# Patient Record
Sex: Female | Born: 2000 | Hispanic: No | Marital: Single
Health system: Southern US, Community
[De-identification: ages and names within clinical notes are randomized; demographics above are authoritative.]

---
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
## 2023-02-16 ENCOUNTER — Other Ambulatory Visit: Payer: Self-pay

## 2023-02-16 ENCOUNTER — Emergency Department (HOSPITAL_COMMUNITY)
Admission: EM | Admit: 2023-02-16 | Discharge: 2023-02-16 | Disposition: A | Discharge status: Home / Self Care | Payer: Medicaid Other | Attending: Emergency Medicine | Admitting: Emergency Medicine

## 2023-02-16 ENCOUNTER — Encounter (HOSPITAL_COMMUNITY): Payer: Self-pay

## 2023-02-16 DIAGNOSIS — R300 Dysuria: Secondary | ICD-10-CM | POA: Diagnosis present

## 2023-02-16 DIAGNOSIS — N3001 Acute cystitis with hematuria: Secondary | ICD-10-CM | POA: Diagnosis not present

## 2023-02-16 LAB — URINALYSIS, ROUTINE W REFLEX MICROSCOPIC
Bilirubin Urine: NEGATIVE
Glucose, UA: NEGATIVE mg/dL
Hgb urine dipstick: NEGATIVE
Ketones, ur: NEGATIVE mg/dL
Nitrite: NEGATIVE
Protein, ur: NEGATIVE mg/dL
Specific Gravity, Urine: 1.002 — ABNORMAL LOW (ref 1.005–1.030)
pH: 7 (ref 5.0–8.0)

## 2023-02-16 LAB — PREGNANCY, URINE: Preg Test, Ur: NEGATIVE

## 2023-02-16 MED ORDER — CEPHALEXIN 500 MG PO CAPS
500.0000 mg | ORAL_CAPSULE | Freq: Two times a day (BID) | ORAL | 0 refills | Status: AC
Start: 2023-02-16 — End: 2023-02-23

## 2023-02-16 NOTE — Discharge Instructions (Addendum)
I have prescribed antibiotics in order to help treat your urinary tract infection, please take 1 tablet twice a day for the next 7 days.  If you experience any fever, back pain, vomiting you will need to return to the emergency department.

## 2023-02-16 NOTE — ED Provider Notes (Signed)
[place] EMERGENCY DEPARTMENT AT [facility] Provider Note   CSN: [id number] Arrival date & time: 02/16/23  1611     History  Chief Complaint  Patient presents with   Dysuria    Carla Bates is a 22 y.o. female.  22 y.o female with a past medical history of recurrent UTIs presents to the ED with dysuria, frequency, feels like she is not fully emptying her bladder.  Patient has a prior history of recurrent UTIs, was on maintenance therapy by her urologist but stopped taking this medication about a month ago as she reports she has not been able to follow-up with him.  She also endorses some foul odor to her urine.  She is having some lower abdominal pressure every time she voids.  She reports the symptoms feel similar to her prior UTIs, however they have not been that bad.  She has not tried any over-the-counter medication.  She is not having any fever, no back pain, no nausea or vomiting.  Denies any vaginal discharge or vaginal bleeding. LMP unknown.   The history is provided by the patient and medical records.  Dysuria Associated symptoms: no abdominal pain, no fever and no flank pain        Home Medications Prior to Admission medications   Medication Sig Start Date End Date Taking? Authorizing Provider  cephALEXin (KEFLEX) 500 MG capsule Take 1 capsule (500 mg total) by mouth 2 (two) times daily for 7 days. 02/16/23 02/23/23 Yes Charlet Harr, Leonie Douglas, PA-C      Allergies    Shellfish allergy    Review of Systems   Review of Systems  Constitutional:  Negative for fever.  Respiratory:  Negative for shortness of breath.   Cardiovascular:  Negative for chest pain.  Gastrointestinal:  Negative for abdominal pain.  Genitourinary:  Positive for dysuria and urgency. Negative for flank pain and hematuria.    Physical Exam Updated Vital Signs BP 111/61 (BP Location: Right Arm)   Pulse 91   Temp 98.9 F (37.2 C) (Oral)   Resp 15   Wt 91.6 kg   SpO2 100%  Physical  Exam Vitals and nursing note reviewed.  Constitutional:      Appearance: Normal appearance.  HENT:     Head: Normocephalic and atraumatic.     Mouth/Throat:     Mouth: Mucous membranes are moist.  Cardiovascular:     Rate and Rhythm: Normal rate.  Pulmonary:     Effort: Pulmonary effort is normal.  Abdominal:     General: Abdomen is flat.     Tenderness: There is abdominal tenderness in the suprapubic area. There is no right CVA tenderness or left CVA tenderness.     Comments: No CVA tenderness, suprapubic tenderness.   Musculoskeletal:     Cervical back: Normal range of motion and neck supple.  Skin:    General: Skin is warm and dry.  Neurological:     Mental Status: She is alert and oriented to person, place, and time.     ED Results / Procedures / Treatments   Labs (all labs ordered are listed, but only abnormal results are displayed) Labs Reviewed  URINALYSIS, ROUTINE W REFLEX MICROSCOPIC - Abnormal; Notable for the following components:      Result Value   Color, Urine STRAW (*)    Specific Gravity, Urine 1.002 (*)    Leukocytes,Ua SMALL (*)    Bacteria, UA FEW (*)    All other components within normal limits  URINE  CULTURE  PREGNANCY, URINE    EKG None  Radiology No results found.  Procedures Procedures    Medications Ordered in ED Medications - No data to display  ED Course/ Medical Decision Making/ A&P Clinical Course as of 02/16/23 1816  Sat Feb 16, 2023  1748 Glori Luis): SMALL [JS]  1748 Bacteria, UA(!): FEW [JS]  1748 WBC, UA: 11-20 [JS]    Clinical Course User Index [JS] Claude Manges, PA-C                                 Medical Decision Making Amount and/or Complexity of Data Reviewed Labs: ordered. Decision-making details documented in ED Course.  Risk Prescription drug management.    Patient presents to the ED with a chief complaint of dysuria, hematuria, urgency which has been ongoing for the past 3 days, no treatment at  home.  Prior history of recurrent UTIs, previously followed by urology with a Cytol Skippy and placed on maintenance medication to prevent further urinary tract infections, however has not seen her urologist in several months.  Feels that the symptoms are similar to her prior, however they are not as bad per patient.  On evaluation there is no suprapubic tenderness.  No CVA tenderness, she is afebrile, no nausea or vomiting to suspect pyelonephritis at this time.  UA does show 11-20 white blood cell count, few bacteria, small leukocytes, this was sent out for culture at this time.  Prior review of urine cultures does report sensitivity to Keflex, will place her on a Keflex course of 7 days twice daily.  She is agreeable to plan and treatment, return precautions discussed at length.  Patient is hemodynamically stable for discharge.   Portions of this note were generated with Scientist, clinical (histocompatibility and immunogenetics). Dictation errors may occur despite best attempts at proofreading.   Final Clinical Impression(s) / ED Diagnoses Final diagnoses:  Acute cystitis with hematuria    Rx / DC Orders ED Discharge Orders          Ordered    cephALEXin (KEFLEX) 500 MG capsule  2 times daily        02/16/23 1806              Claude Manges, PA-C 02/16/23 1817    Gwyneth Sprout, MD 02/17/23 1504

## 2023-02-16 NOTE — ED Triage Notes (Signed)
Pt arrives with c/o dysuria that started a few days ago. Pt endorses pelvic discomfort, urinary frequency and urgency,and urine has a odor. Pt denies fevers.

## 2023-02-18 LAB — URINE CULTURE: Culture: >=100000 — AB

## 2023-02-19 ENCOUNTER — Telehealth (HOSPITAL_BASED_OUTPATIENT_CLINIC_OR_DEPARTMENT_OTHER): Payer: Self-pay | Admitting: *Deleted

## 2023-02-19 NOTE — Telephone Encounter (Signed)
Post ED Visit - Positive Culture Follow-up  Culture report reviewed by antimicrobial stewardship pharmacist: [facility] []  Enzo Bi, Pharm.D. [x]  Celedonio Miyamoto, Pharm.D., BCPS AQ-ID []  Garvin Fila, Pharm.D., BCPS []  Georgina Pillion, [address].D., BCPS []  [place], [address].D., BCPS, AAHIVP []  Estella Husk, Pharm.D., BCPS, AAHIVP []  Lysle Pearl, PharmD, BCPS []  Phillips Climes, PharmD, BCPS []  Agapito Games, PharmD, BCPS []  Verlan Friends, PharmD []  Mervyn Gay, PharmD, BCPS []  Vinnie Level, PharmD  [facility] []  Len Childs, PharmD []  Greer Pickerel, PharmD []  Adalberto Cole, PharmD []  Perlie Gold, Rph []  Lonell Face) Jean Rosenthal, PharmD []  Earl Many, PharmD []  Junita Push, PharmD []  Dorna Leitz, PharmD []  Terrilee Files, PharmD []  Lynann Beaver, PharmD []  Keturah Barre, PharmD []  Loralee Pacas, PharmD []  Bernadene Person, PharmD   Positive urine culture Treated with cephalexin, organism sensitive to the same and no further patient follow-up is required at this time.  Nena Polio Garner Nash 02/19/2023, 9:59 AM

## 2023-04-13 ENCOUNTER — Emergency Department (HOSPITAL_COMMUNITY)
Admission: EM | Admit: 2023-04-13 | Discharge: 2023-04-13 | Disposition: A | Discharge status: Home / Self Care | Payer: Medicaid Other | Attending: Emergency Medicine | Admitting: Emergency Medicine

## 2023-04-13 ENCOUNTER — Encounter (HOSPITAL_COMMUNITY): Payer: Self-pay

## 2023-04-13 ENCOUNTER — Other Ambulatory Visit: Payer: Self-pay

## 2023-04-13 DIAGNOSIS — N760 Acute vaginitis: Secondary | ICD-10-CM

## 2023-04-13 DIAGNOSIS — R300 Dysuria: Secondary | ICD-10-CM | POA: Diagnosis present

## 2023-04-13 DIAGNOSIS — B3731 Acute candidiasis of vulva and vagina: Secondary | ICD-10-CM | POA: Diagnosis not present

## 2023-04-13 LAB — URINALYSIS, ROUTINE W REFLEX MICROSCOPIC
Bacteria, UA: NONE SEEN
Bilirubin Urine: NEGATIVE
Glucose, UA: NEGATIVE mg/dL
Hgb urine dipstick: NEGATIVE
Ketones, ur: NEGATIVE mg/dL
Nitrite: NEGATIVE
Protein, ur: NEGATIVE mg/dL
Specific Gravity, Urine: 1.002 — ABNORMAL LOW (ref 1.005–1.030)
pH: 6 (ref 5.0–8.0)

## 2023-04-13 LAB — WET PREP, GENITAL
Sperm: NONE SEEN
Trich, Wet Prep: NONE SEEN
WBC, Wet Prep HPF POC: >=10 — AB (ref <10)

## 2023-04-13 LAB — PREGNANCY, URINE: Preg Test, Ur: NEGATIVE

## 2023-04-13 MED ORDER — FLUCONAZOLE 150 MG PO TABS
150.0000 mg | ORAL_TABLET | Freq: Once | ORAL | Status: AC
  Administered 2023-04-13: 150 mg via ORAL
  Filled 2023-04-13: qty 1

## 2023-04-13 MED ORDER — METRONIDAZOLE 500 MG PO TABS: 500.0000 mg | ORAL_TABLET | Freq: Two times a day (BID) | ORAL | 0 refills | Status: AC

## 2023-04-13 MED ORDER — METRONIDAZOLE 500 MG PO TABS
500.0000 mg | ORAL_TABLET | Freq: Once | ORAL | Status: AC
  Administered 2023-04-13: 500 mg via ORAL
  Filled 2023-04-13: qty 1

## 2023-04-13 NOTE — ED Provider Notes (Signed)
[place] [facility] DEPARTMENT AT [facility] Provider Note   CSN: [id number] Arrival date & time: 04/13/23  1645     History  Chief Complaint  Patient presents with   UTI    Carla Bates is a 22 y.o. female past medical history significant for frequent urinary tract infections who presents today for dysuria.  Patient states she began experiencing dysuria this morning and also has pelvic pain.  She denies hematuria, flank pain, fevers, new sexual partners, potential STI exposure.  She is to be followed by urology but has not been seen since June 2023.  She expresses wanting to reestablish care with urology.  She was seen 03/15/2019 for for acute cystitis for which she completed the antibiotics and had symptom relief.  HPI     Home Medications Prior to Admission medications   Medication Sig Start Date End Date Taking? Authorizing Provider  metroNIDAZOLE (FLAGYL) 500 MG tablet Take 1 tablet (500 mg total) by mouth 2 (two) times daily. 04/13/23  Yes Dolphus Jenny, PA-C      Allergies    Shellfish allergy    Review of Systems   Review of Systems  Constitutional:  Negative for chills and fever.  Gastrointestinal:  Positive for vomiting. Negative for diarrhea and nausea.  Genitourinary:  Positive for dysuria, frequency and pelvic pain. Negative for hematuria.  Neurological:  Negative for weakness.    Physical Exam Updated Vital Signs BP 114/66 (BP Location: Right Arm)   Pulse 85   Temp 98 F (36.7 C)   Resp 18   Ht 5\' 3"  (1.6 m)   Wt 90.7 kg   SpO2 97%   BMI 35.43 kg/m  Physical Exam Vitals and nursing note reviewed.  Constitutional:      General: She is not in acute distress.    Appearance: She is well-developed.  HENT:     Head: Normocephalic and atraumatic.  Eyes:     Conjunctiva/sclera: Conjunctivae normal.     Pupils: Pupils are equal, round, and reactive to light.  Cardiovascular:     Rate and Rhythm: Normal rate and regular rhythm.     Heart  sounds: No murmur heard. Pulmonary:     Effort: Pulmonary effort is normal. No respiratory distress.     Breath sounds: Normal breath sounds.  Abdominal:     Palpations: Abdomen is soft.     Tenderness: There is abdominal tenderness in the suprapubic area.  Musculoskeletal:        General: No swelling.     Cervical back: Neck supple.     Right lower leg: No edema.     Left lower leg: No edema.  Skin:    General: Skin is warm and dry.  Neurological:     General: No focal deficit present.     Mental Status: She is alert.  Psychiatric:        Mood and Affect: Mood normal.     ED Results / Procedures / Treatments   Labs (all labs ordered are listed, but only abnormal results are displayed) Labs Reviewed  WET PREP, GENITAL - Abnormal; Notable for the following components:      Result Value   Yeast Wet Prep HPF POC PRESENT (*)    Clue Cells Wet Prep HPF POC PRESENT (*)    WBC, Wet Prep HPF POC >=10 (*)    All other components within normal limits  URINALYSIS, ROUTINE W REFLEX MICROSCOPIC - Abnormal; Notable for the following components:  Color, Urine STRAW (*)    Specific Gravity, Urine 1.002 (*)    Leukocytes,Ua SMALL (*)    All other components within normal limits  URINE CULTURE  PREGNANCY, URINE    EKG None  Radiology No results found.  Procedures Procedures    Medications Ordered in ED Medications  metroNIDAZOLE (FLAGYL) tablet 500 mg (has no administration in time range)  fluconazole (DIFLUCAN) tablet 150 mg (has no administration in time range)    ED Course/ Medical Decision Making/ A&P                                 Medical Decision Making Amount and/or Complexity of Data Reviewed Labs: ordered.  This patient presents to the ED with chief complaint(s) of dysuria with pertinent past medical history of frequent UTIs which further complicates the presenting complaint. The complaint involves an extensive differential diagnosis and also carries with it  a high risk of complications and morbidity.    The differential diagnosis includes UTI, yeast infection, BV  Additional history obtained: Records reviewed urology office visit notes  ED Course and Reassessment: Discussed with patient treatment for BV and yeast infection.  Explained to patient risks with frequent antibiotic use and not treating UTI when she was positive for BV and yeast. Urine sent for culture given history of frequent UTIs  Independent labs interpretation:  The following labs were independently interpreted:  UA: Negative for bacteria Wet prep: Yeast and clue cells present  Consultation: - Consulted or discussed management/test interpretation w/ external professional: None  Consideration for admission or further workup: Has a history of frequent UTIs, used is to be followed by urology and would like to reestablish care.         Final Clinical Impression(s) / ED Diagnoses Final diagnoses:  Vulvovaginal candidiasis  BV (bacterial vaginosis)    Rx / DC Orders ED Discharge Orders          Ordered    metroNIDAZOLE (FLAGYL) 500 MG tablet  2 times daily        04/13/23 2333              Dolphus Jenny, PA-C 04/13/23 2336    Palumbo, April, MD 04/14/23 2130

## 2023-04-13 NOTE — ED Notes (Addendum)
PT ambulate to bathroom and provided Urine sample for lab.

## 2023-04-13 NOTE — ED Triage Notes (Signed)
Pt came in via POV d/t burning when she urinates since this morning, denies recent fevers, blood in her urine or flank pain. Does have lower abd pain. A/Ox4, rates her pain 3/10 while in triage.

## 2023-04-13 NOTE — Discharge Instructions (Addendum)
Today you were treated for BV and a yeast infection.  Your urine was sent off for culture and you will get a call in approximately 2 days if treatment is needed.  Thank you for letting us treat you today. After reviewing your labs and imaging, I feel you are safe to go home. Please follow up with your PCP in the next several days and provide them with your records from this visit. Return to the Emergency Room if pain becomes severe or symptoms worsen.

## 2023-04-16 LAB — URINE CULTURE: Culture: >=100000 — AB

## 2023-04-17 ENCOUNTER — Telehealth (HOSPITAL_BASED_OUTPATIENT_CLINIC_OR_DEPARTMENT_OTHER): Payer: Self-pay

## 2023-04-17 NOTE — Telephone Encounter (Signed)
Post ED Visit - Positive Culture Follow-up  Culture report reviewed by antimicrobial stewardship pharmacist: [facility] [x]  Theotis Burrow, [place].D. []  Celedonio Miyamoto, Pharm.D., BCPS AQ-ID []  Garvin Fila, Pharm.D., BCPS []  Georgina Pillion, [address].D., BCPS []  [place], [address].D., BCPS, AAHIVP []  Estella Husk, Pharm.D., BCPS, AAHIVP []  Lysle Pearl, PharmD, BCPS []  Phillips Climes, PharmD, BCPS []  Agapito Games, PharmD, BCPS []  Verlan Friends, PharmD []  Mervyn Gay, PharmD, BCPS []  Vinnie Level, PharmD  [facility] []  Len Childs, PharmD []  Greer Pickerel, PharmD []  Adalberto Cole, PharmD []  Perlie Gold, Rph []  Lonell Face) Jean Rosenthal, PharmD []  Earl Many, PharmD []  Junita Push, PharmD []  Dorna Leitz, PharmD []  Terrilee Files, PharmD []  Lynann Beaver, PharmD []  Keturah Barre, PharmD []  Loralee Pacas, PharmD []  Bernadene Person, PharmD   Positive urine culture Treated with Metronidazole, organism sensitive to the same and no further patient follow-up is required at this time.  Sandria Senter 04/17/2023, 9:51 AM

## 2023-04-17 NOTE — Progress Notes (Addendum)
ED Antimicrobial Stewardship Positive Culture Follow Up   [name] is an 22 y.o. female who presented to [facility] on 04/13/2023 with a chief complaint of UTI  Chief Complaint  Patient presents with   UTI    Recent Results (from the past 720 hour(s))  Wet prep, genital     Status: Abnormal   Collection Time: 04/13/23 10:56 PM   Specimen: Vaginal  Result Value Ref Range Status   Yeast Wet Prep HPF POC PRESENT (A) NONE SEEN Final   Trich, Wet Prep NONE SEEN NONE SEEN Final   Clue Cells Wet Prep HPF POC PRESENT (A) NONE SEEN Final    Comment: Swab received with less than 0.5 mL of saline, saline added to specimen, interpret results with caution.   WBC, Wet Prep HPF POC >=10 (A) <10 Final   Sperm NONE SEEN  Final    Comment: Performed at [facility], [address], [place], [place] [address]  Urine Culture     Status: Abnormal   Collection Time: 04/13/23 11:48 PM   Specimen: Urine, Clean Catch  Result Value Ref Range Status   Specimen Description URINE, CLEAN CATCH  Final   Special Requests   Final    NONE Performed at [facility], [address], [place], [place] [address]    Culture >=100,000 COLONIES/mL ESCHERICHIA COLI (A)  Final   Report Status 04/16/2023 FINAL  Final   Organism ID, Bacteria ESCHERICHIA COLI (A)  Final      Susceptibility   Escherichia coli - MIC*    AMPICILLIN 4 SENSITIVE Sensitive     CEFAZOLIN <=4 SENSITIVE Sensitive     CEFEPIME <=0.12 SENSITIVE Sensitive     CEFTRIAXONE <=0.25 SENSITIVE Sensitive     CIPROFLOXACIN <=0.25 SENSITIVE Sensitive     GENTAMICIN <=1 SENSITIVE Sensitive     IMIPENEM <=0.25 SENSITIVE Sensitive     NITROFURANTOIN <=16 SENSITIVE Sensitive     TRIMETH/SULFA <=20 SENSITIVE Sensitive     AMPICILLIN/SULBACTAM <=2 SENSITIVE Sensitive     PIP/TAZO <=4 SENSITIVE Sensitive     * >=100,000 COLONIES/mL ESCHERICHIA COLI    22 yo female patient who came in for burning during urination and lower abdominal pain. Pt  had a recent UTI treated with Keflex. Wet prep was done that showed clue cells and yeast. UA negative for white blood cells and nitrites with only trace leukocytes. Symptoms were attributed to bacterial vaginosis and vaginal candidiasis and not UTI. Patient was treated with one dose of fluconazole in the ED and sent home with metronidazole for bacterial vaginosis.   New antibiotic prescription: None needed  ED Provider: Virgina Norfolk, DO    Griffin Dakin 04/17/2023, 8:45 AM Clinical Pharmacist Monday - Friday phone -  [phone] Saturday - Sunday phone - [phone]

## 2024-04-06 ENCOUNTER — Encounter (HOSPITAL_COMMUNITY): Payer: Self-pay | Admitting: Student in an Organized Health Care Education/Training Program

## 2024-04-14 ENCOUNTER — Ambulatory Visit (INDEPENDENT_AMBULATORY_CARE_PROVIDER_SITE_OTHER)

## 2024-04-14 DIAGNOSIS — F411 Generalized anxiety disorder: Secondary | ICD-10-CM

## 2024-04-14 DIAGNOSIS — F331 Major depressive disorder, recurrent, moderate: Secondary | ICD-10-CM | POA: Diagnosis not present

## 2024-04-14 NOTE — Progress Notes (Signed)
 Comprehensive Clinical Assessment (CCA) Note  04/14/2024 Carla Bates [id number]  Chief Complaint:  Chief Complaint  Patient presents with   Anxiety    Depression    Visit Diagnosis: Major Depressive Disorder, recurrent, Moderate, Generalized Anxiety Disorder   CCA Screening, Triage and Referral (STR)  Patient Reported Information How did you hear about us ? Family/Friend  Referral name: sister  Referral phone number: No data recorded  Whom do you see for routine medical problems? Primary Care  Practice/Facility Name: [facility]  Practice/Facility Phone Number: No data recorded Name of Contact: No data recorded Contact Number: No data recorded Contact Fax Number: No data recorded Prescriber Name: No data recorded Prescriber Address (if known): No data recorded  What Is the Reason for Your Visit/Call Today? depression, anxiety  How Long Has This Been Causing You Problems? 1-6 months  What Do You Feel Would Help You the Most Today? Treatment for Depression or other mood problem   Have You Recently Been in Any Inpatient Treatment (Hospital/Detox/Crisis [facility]/28-Day Program)? No  Name/Location of Program/Hospital:No data recorded How Long Were You There? No data recorded When Were You Discharged? No data recorded  Have You Ever Received Services From [facility] Before? Yes  Who Do You See at [facility]? family MD   Have You Recently Had Any Thoughts About Hurting Yourself? No  Are You Planning to Commit Suicide/Harm Yourself At This time? No   Have you Recently Had Thoughts About Hurting Someone Carla Bates? No  Explanation: No data recorded  Have You Used Any Alcohol or Drugs in the Past 24 Hours? No  How Long Ago Did You Use Drugs or Alcohol? No data recorded What Did You Use and How Much? No data recorded  Do You Currently Have a Therapist/Psychiatrist? No  Name of Therapist/Psychiatrist: No data recorded  Have You Been Recently Discharged From Any  Office Practice or Programs? No data recorded Explanation of Discharge From Practice/Program: No data recorded    CCA Screening Triage Referral Assessment Type of Contact: Face-to-Face  Is this Initial or Reassessment? No data recorded Date Telepsych consult ordered in [facility]:  No data recorded Time Telepsych consult ordered in [facility]:  No data recorded  Patient Reported Information Reviewed? No data recorded Patient Left Without Being Seen? No data recorded Reason for Not Completing Assessment: No data recorded  Collateral Involvement: Mother is accompanying Carla Bates   Does Patient Have a Automotive engineer Guardian? No data recorded Name and Contact of Legal Guardian: No data recorded If Minor and Not Living with Parent(s), Who has Custody? No data recorded Is CPS involved or ever been involved? Never  Is APS involved or ever been involved? Never   Patient Determined To Be At Risk for Harm To Self or Others Based on Review of Patient Reported Information or Presenting Complaint? No  Method: No Plan  Availability of Means: No data recorded Intent: Vague intent or NA  Notification Required: No need or identified person  Additional Information for Danger to Others Potential: No data recorded Additional Comments for Danger to Others Potential: No data recorded Are There Guns or Other Weapons in Your Home? No  Types of Guns/Weapons: No data recorded Are These Weapons Safely Secured?                            No data recorded Who Could Verify You Are Able To Have These Secured: No data recorded Do You Have any Outstanding Charges,  Pending Court Dates, Parole/Probation? No data recorded Contacted To Inform of Risk of Harm To Self or Others: No data recorded  Location of Assessment: [facility]   Does Patient Present under Involuntary Commitment? No  IVC Papers Initial File Date: No data recorded  [place] of Residence: [place]   Patient Currently Receiving  the Following Services: Not Receiving Services   Determination of Need: Routine (7 days)   Options For Referral: Outpatient Therapy; Medication Management     CCA Biopsychosocial Intake/Chief Complaint:  Carla Bates presents to the walk in clinic today. She is accompanied by her mother.  She says she has an appointment with Dr. Cecily on Thrusday, though this therapist does not see it in Epic.  Carla Bates scores a 13 on both her PHQ-9 and GAD-7.   She says her anxiety onset about 2 months ago as the stress of school is triggering her anxiety. She reports her depression is no worse than it was in 2020. Her mother reports that as a later teenager she noticed Carla Bates had difficulties with adjust ments in life and that is when she noticed her daughter was not as happy as she used to be.  Mother says her sister is seen at this clinic but she did not realized until then that Carla Bates had issues. Mother reports Carla Bates received [facility] at [facility] when she was a teenager.  Family History of Mental Health and Substance Use: Carla Bates Sister is seen here at [facility] Carla Bates and her mother report no know history of family susbstance use. Carla Bates is in her 4th year of undergraduate school and is studying nursing.   Current Symptoms/Problems: PHQ-9 is 9.  GAD-7 is 13.   Patient Reported Schizophrenia/Schizoaffective Diagnosis in Past: No   Strengths: good Consulting civil engineer, Loves to learn  Preferences: out pt services  Abilities: communications  Mother says she draws and paints before she became depressed   Type of Services Patient Feels are Needed: medication and therapy   Initial Clinical Notes/Concerns: No data recorded  Mental Health Symptoms Depression:  -- (PHQ-9 is 9)   Duration of Depressive symptoms: No data recorded  Mania:  None   Anxiety:   -- (GAD-7 is 13)   Psychosis:  None   Duration of Psychotic symptoms: No data recorded  Trauma:  None   Obsessions:  None    Compulsions:  None   Inattention:  None   Hyperactivity/Impulsivity:  No data recorded  Oppositional/Defiant Behaviors:  None   Emotional Irregularity:  None   Other Mood/Personality Symptoms:  No data recorded   Mental Status Exam Appearance and self-care  Stature:  No data recorded  Weight:  Average weight   Clothing:  Casual   Grooming:  Normal   Cosmetic use:  None   Posture/gait:  Normal   Motor activity:  Not Remarkable   Sensorium  Attention:  Normal   Concentration:  Normal   Orientation:  X5   Recall/memory:  Normal   Affect and Mood  Affect:  Anxious   Mood:  Other (Comment) (unsure)   Relating  Eye contact:  Normal   Facial expression:  Responsive   Attitude toward examiner:  Cooperative   Thought and Language  Speech flow: Clear and Coherent   Thought content:  Appropriate to Mood and Circumstances   Preoccupation:  Other (Comment)   Hallucinations:  Other (Comment)   Organization:  No data recorded  [employer] of Knowledge:  No data recorded  Intelligence:  No  data recorded  Abstraction:  Functional   Judgement:  Fair   Reality Testing:  Adequate   Insight:  Fair   Decision Making:  No data recorded  Social Functioning  Social Maturity:  Responsible   Social Judgement:  Normal   Stress  Stressors:  School; Transitions   Coping Ability:  Human resources officer Deficits:  None   Supports:  Family; Friends/Service system     Religion: Religion/Spirituality Are You A Religious Person?: Yes What is Your Religious Affiliation?: Catholic  Leisure/Recreation: Leisure / Recreation Do You Have Hobbies?: Yes Leisure and Hobbies: painting and drawing  Exercise/Diet: Exercise/Diet Do You Exercise?: No Have You Gained or Lost A Significant Amount of Weight in the Past Six Months?: No Do You Follow a Special Diet?: No Do You Have Any Trouble Sleeping?: Yes Explanation of Sleeping Difficulties: difficulty  sustaining food   CCA Employment/Education Employment/Work Situation: Employment / Work Situation Employment Situation: Surveyor, minerals Job has Been Impacted by Current Illness: Yes Describe how Patient's Job has Been Impacted: feels like she does not fit in on campus.  Can studying What is the Longest Time Patient has Held a Job?: student Has Patient ever Been in the [employer]?: No  Education: Education Is Patient Currently Attending School?: Yes School Currently Attending: [employer] Last Grade Completed: 13 Did You Graduate From [employer]?: Yes Did You Attend College?: Yes What Type of College Degree Do you Have?: in 4th year for BS in Biology . wants to be a nurse Did You Attend Graduate School?: No Did You Have Any Difficulty At School?: No Patient's Education Has Been Impacted by Current Illness: Yes How Does Current Illness Impact Education?: feels left out and people do not like her   CCA Family/Childhood History Family and Relationship History: Family history Marital status: Single Does patient have children?: No  Childhood History:  Childhood History By whom was/is the patient raised?: Mother Description of patient's relationship with caregiver when they were a child: Pt says her father did not participate in raising her and that her mother raised her. Patient's description of current relationship with people who raised him/her: no contact with father.  He lives in [place].  Close with mom How were you disciplined when you got in trouble as a child/adolescent?: lectures Does patient have siblings?: Yes Number of Siblings: 1 Description of patient's current relationship with siblings: not much communication Did patient suffer any verbal/emotional/physical/sexual abuse as a child?: No Did patient suffer from severe childhood neglect?: No Has patient ever been sexually abused/assaulted/raped as an adolescent or adult?: No Was the patient ever a victim of a crime or a  disaster?: No Witnessed domestic violence?: No Has patient been affected by domestic violence as an adult?: No  Child/Adolescent Assessment:     CCA Substance Use Alcohol/Drug Use: Alcohol / Drug Use Pain Medications: none Prescriptions: none Over the Counter: none History of alcohol / drug use?: No history of alcohol / drug abuse                         ASAM's:  Six Dimensions of Multidimensional Assessment  Dimension 1:  Acute Intoxication and/or Withdrawal Potential:      Dimension 2:  Biomedical Conditions and Complications:      Dimension 3:  Emotional, Behavioral, or Cognitive Conditions and Complications:     Dimension 4:  Readiness to Change:     Dimension 5:  Relapse, Continued use, or Continued Problem Potential:  Dimension 6:  Recovery/Living Environment:     ASAM Severity Score:    ASAM Recommended Level of Treatment:     Substance use Disorder (SUD)    Recommendations for Services/Supports/Treatments:    DSM5 Diagnoses: Patient Active Problem List   Diagnosis Date Noted   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 Patient/Guardian was advised if they have not already done so to contact the registration department to sign all necessary forms in order for us  to release information regarding their care.   Consent: Patient/Guardian gives verbal consent for treatment and assignment of benefits for services  provided during this visit. Patient/Guardian expressed understanding and agreed to proceed.   Plan:  Carla Bates says she has an appointment with Dr. Cecily this Thursday and has already spoken to her and she will arrange therapist for Andrea Darice Simpler, MS, LMFT, LCAS

## 2024-04-16 ENCOUNTER — Ambulatory Visit (HOSPITAL_COMMUNITY): Admitting: Physician Assistant

## 2024-04-16 VITALS — BP 115/74 | HR 70 | Temp 97.3°F | Ht 63.0 in | Wt 206.6 lb

## 2024-04-16 DIAGNOSIS — F411 Generalized anxiety disorder: Secondary | ICD-10-CM | POA: Diagnosis not present

## 2024-04-16 DIAGNOSIS — F331 Major depressive disorder, recurrent, moderate: Secondary | ICD-10-CM | POA: Diagnosis not present

## 2024-04-16 NOTE — Progress Notes (Unsigned)
 Psychiatric Initial Adult Assessment   Patient Identification: Carla Bates MRN:  [id number] Date of Evaluation:  04/16/2024 Referral Source: Walk-in Chief Complaint:   Chief Complaint  Patient presents with   Establish Care   Visit Diagnosis:    ICD-10-CM   1. Major depressive disorder, recurrent episode, moderate (HCC)  F33.1     2. Generalized anxiety disorder  F41.1       History of Present Illness:  ***  Carla Bates is a 23 year old female with a past psychiatric history significant for major depressive disorder (recurrent episode, moderate) and generalized anxiety disorder who presents to [facility], accompanied by her mother Carla Bates, [phone]), to establish psychiatric care and for medication management.  Patient presents to the encounter requesting medication management over her current symptoms.  She reports that she received her diagnoses of depression and anxiety in 2020 during therapy.  Patient reports that she recently started experiencing depression.  Per patient's mother, patient is in college and she has noticed that the patient has not been acting like herself.  She also reports that she has noticed the patient appearing more down lately.  Patient endorses depression and rates her depression as 5 out of 10 with 10 being most severe.  Patient endorses depressive episodes 5 days/week.  Patient endorses the following depressive symptoms: feelings of sadness, crying spells, lack of motivation, decreased concentration, irritability, decreased energy, feelings of guilt/worthlessness, and hopelessness.  One of the contributors to the patient's depression is school.  Alleviating factor to her depression is when she is with family.  Patient denies a past history of medication management.  In addition to depression, patient endorses anxiety and rates her anxiety a 7 out of 10.  Patient reports that her anxiety is  accompanied by nervousness, restlessness, and muscle tension.  Patient denies any specific triggers to her anxiety.  Patient's main stressor revolves around school.  In addition to anxiety, patient endorses panic attacks, albeit rarely.  Patient reports that she last experienced a panic attack roughly a month ago.  She reports that her panic attacks are typically attributed to irritation.  Patient's panic attacks are characterized by the following symptoms: crying spells, hyperventilation, and elevated heart rate.  Patient denies a past history of hospitalization due to mental health.  Patient further denies a past history of suicide attempt.  A PHQ-9 screen was performed with the patient scoring a 16.  A GAD-7 screen was also performed with the patient scoring an 18.  Patient is alert and oriented x 4, calm, cooperative, and fully engaged in conversation during the encounter.  Patient endorses neutral mood.  Patient exhibits depressed mood with congruent affect.  Patient denies suicidal or homicidal ideations.  She further denies auditory or visual hallucinations and does not appear to be responding to internal/external stimuli.  Patient denies paranoia or delusional thoughts.  Patient endorses fair sleep (on average 6 to 8 hours of sleep per night.  Patient reports that she occasionally receives more than 8 hours of sleep per night.  Patient endorses good appetite and eats on average 2-3 meals per day.  Patient denies alcohol consumption, tobacco use, or illicit drug use.  Associated Signs/Symptoms: Depression Symptoms:  depressed mood, anhedonia, psychomotor agitation, fatigue, feelings of worthlessness/guilt, difficulty concentrating, hopelessness, anxiety, panic attacks, loss of energy/fatigue, disturbed sleep, (Hypo) Manic Symptoms:  Distractibility, Flight of Ideas, Irritable Mood, Labiality of Mood, Anxiety Symptoms:  Agoraphobia, Excessive Worry, Panic Symptoms, Social  Anxiety, Psychotic Symptoms:  Patient  denies PTSD Symptoms: Negative  Past Psychiatric History:  Patient reports that she was diagnosed with depression and anxiety.  Patient denies a past history of hospitalization due to mental health.  Patient denies a past history of suicide attempt.  Patient denies a past history of homicide attempt.  Previous Psychotropic Medications: No   Substance Abuse History in the last 12 months:  No.  Consequences of Substance Abuse: Negative  Past Medical History: History reviewed. No pertinent past medical history. History reviewed. No pertinent surgical history.  Family Psychiatric History:  Half sister - schizophrenia, OCD  Family history of suicide attempt: Patient denies Family history of homicide attempt: Patient denies Family history of substance abuse: Patient denies  Family History: History reviewed. No pertinent family history.  Social History:   Social History   Socioeconomic History   Marital status: Single    Spouse name: Not on file   Number of children: Not on file   Years of education: Not on file   Highest education level: Not on file  Occupational History   Not on file  Tobacco Use   Smoking status: Unknown   Smokeless tobacco: Not on file  Substance and Sexual Activity   Alcohol use: Not Currently   Drug use: Not Currently   Sexual activity: Yes  Other Topics Concern   Not on file  Social History Narrative   Not on file   Social Drivers of Health   Financial Resource Strain: Not on File (01/05/2022)   Received from [employer]    Financial Resource Strain: 0  Food Insecurity: Not at Risk (05/07/2023)   Received from [facility] Insecurity    Within the past 12 months, you worried that your food would run out before you got money to buy more.: 1  Transportation Needs: Not at Risk (05/07/2023)   Received from [facility] Needs    In the past 12 months, has lack of  transportation kept you from medical appointments, meetings, work or from getting things needed for daily living?: 1  Physical Activity: Not on File (01/05/2022)   Received from [facility]   Physical Activity    Physical Activity: 0  Stress: Not on File (01/05/2022)   Received from [facility]   Stress    Stress: 0  Social Connections: Not on File (03/28/2023)   Received from [employer]   Social Connections    Connectedness: 0    Additional Social History:  Patient endorses social support.  Patient denies having children of her own.  Patient endorses housing.  Patient is currently unemployed.  Patient denies a past history of military experience.  Patient denies a past history of prison or jail time.  Patient is currently attending [employer].  Patient denies access to weapons.  Allergies:   Allergies  Allergen Reactions   Shellfish Allergy Hives    Metabolic Disorder Labs: No results found for: HGBA1C, MPG No results found for: PROLACTIN No results found for: CHOL, TRIG, HDL, CHOLHDL, VLDL, LDLCALC No results found for: TSH  Therapeutic Level Labs: No results found for: LITHIUM No results found for: CBMZ No results found for: VALPROATE  Current Medications: Current Outpatient Medications  Medication Sig Dispense Refill   metroNIDAZOLE  (FLAGYL ) 500 MG tablet Take 1 tablet (500 mg total) by mouth 2 (two) times daily. 14 tablet 0   No current facility-administered medications for this visit.    Musculoskeletal: Strength & Muscle Tone: within normal limits Gait & Station:  normal Patient leans: N/A  Psychiatric Specialty Exam: Review of Systems  Psychiatric/Behavioral:  Positive for dysphoric mood and sleep disturbance. Negative for decreased concentration, hallucinations, self-injury and suicidal ideas. The patient is nervous/anxious. The patient is not hyperactive.     Blood pressure 115/74, pulse 70, temperature (!) 97.3 F (36.3 C), temperature source Oral,  height 5' 3 (1.6 m), weight 206 lb 9.6 oz (93.7 kg).Body mass index is 36.6 kg/m.  General Appearance: Casual  Eye Contact:  Good  Speech:  Clear and Coherent and Normal Rate  Volume:  Normal  Mood:  Anxious and Depressed  Affect:  Congruent  Thought Process:  Coherent and Descriptions of Associations: Intact  Orientation:  Full (Time, Place, and Person)  Thought Content:  WDL  Suicidal Thoughts:  No  Homicidal Thoughts:  No  Memory:  Immediate;   Good Recent;   Good Remote;   Good  Judgement:  Good  Insight:  Good  Psychomotor Activity:  Normal  Concentration:  Concentration: Good and Attention Span: Good  Recall:  Good  Fund of Knowledge:Good  Language: Good  Akathisia:  No  Handed:  Right  AIMS (if indicated):  not done  Assets:  Communication Skills Desire for Improvement Financial Resources/Insurance Housing Physical Health Social Support Vocational/Educational  ADL's:  Intact  Cognition: WNL  Sleep:  Fair   Screenings: GAD-7    Flowsheet Row Office Visit from 04/16/2024 in [facility]  Total GAD-7 Score 18   PHQ2-9    Flowsheet Row Office Visit from 04/16/2024 in [facility] Counselor from 04/14/2024 in [facility]  PHQ-2 Total Score 5 4  PHQ-9 Total Score 16 13   Flowsheet Row Office Visit from 04/16/2024 in [facility] Counselor from 04/14/2024 in [facility] ED from 04/13/2023 in [facility] Emergency Department at [facility]  C-SSRS RISK CATEGORY No Risk Error: Question 6 not populated No Risk    Assessment and Plan: ***  Carla Bates is a 23 year old female with a past psychiatric history significant for major depressive disorder (recurrent episode, moderate) and generalized anxiety disorder who presents to [facility], accompanied by her mother Carla Bates, [phone]), to establish psychiatric care and for medication management.  Collaboration of Care: Primary Care Provider AEB patient being seen by a family medicine provider, Psychiatrist AEB patient being followed by a mental health provider at this facility, and Referral or follow-up with counselor/therapist AEB patient to be set up with a licensed clinical social worker at this facility  Patient/Guardian was advised Release of Information must be obtained prior to any record release in order to collaborate their care with an outside provider. Patient/Guardian was advised if they have not already done so to contact the registration department to sign all necessary forms in order for us  to release information regarding their care.   Consent: Patient/Guardian gives verbal consent for treatment and assignment of benefits for services provided during this visit. Patient/Guardian expressed understanding and agreed to proceed.   1. Major depressive disorder, recurrent episode, moderate (HCC) (Primary)  2. Generalized anxiety disorder  Patient to follow up in 6 weeks with Marlo Masson, MD Provider spent a total of 50 minutes with the patient/reviewing patient's chart  Reginia FORBES Bolster, PA 10/2/20259:21 AM

## 2024-04-19 ENCOUNTER — Encounter (HOSPITAL_COMMUNITY): Payer: Self-pay | Admitting: Physician Assistant

## 2024-04-19 DIAGNOSIS — F331 Major depressive disorder, recurrent, moderate: Secondary | ICD-10-CM | POA: Insufficient documentation

## 2024-04-19 DIAGNOSIS — F411 Generalized anxiety disorder: Secondary | ICD-10-CM | POA: Insufficient documentation

## 2024-04-21 NOTE — Telephone Encounter (Signed)
 This encounter was created in error - please disregard.

## 2024-05-28 ENCOUNTER — Encounter (HOSPITAL_COMMUNITY): Payer: Self-pay | Admitting: Student in an Organized Health Care Education/Training Program

## 2024-05-28 ENCOUNTER — Ambulatory Visit (INDEPENDENT_AMBULATORY_CARE_PROVIDER_SITE_OTHER): Admitting: Student in an Organized Health Care Education/Training Program

## 2024-05-28 VITALS — BP 105/73 | HR 58 | Ht 63.0 in | Wt 209.0 lb

## 2024-05-28 DIAGNOSIS — F331 Major depressive disorder, recurrent, moderate: Secondary | ICD-10-CM

## 2024-05-28 DIAGNOSIS — F411 Generalized anxiety disorder: Secondary | ICD-10-CM | POA: Diagnosis not present

## 2024-05-28 NOTE — Progress Notes (Signed)
 BH MD Outpatient Progress Note  05/28/2024 5:28 PM Carla Bates  MRN:  [id number]  Assessment:  Carla Bates presents for follow-up evaluation on 05/28/24 .   Since the last visit, the patient's depressive and anxiety symptoms remain unchanged, with ongoing stressors related to family dynamics contributing to her distress. She is hesitant to start medication but was open to discussion and received psychoeducation regarding antidepressant options. She prefers to consider medication at a future visit and is currently benefiting from therapy. No acute safety concerns identified. Plan is to continue current therapeutic approach, revisit medication initiation at next visit, and provide ongoing support.   Identifying Information: Carla Bates is a 23 y.o. female with a history of MDD and GAD who is an established patient with [facility] for management of mood symptoms. Initial evaluation by Collene Reginia BRAVO, PA completed on 04/16/2024. For a comprehensive history and detailed assessment, please refer to the initial adult assessment.  Plan:  # MDD # GAD Past medication trials:  Status of problem: active Interventions: -- Patient requested to defer starting medications until next follow up visit -- Continue therapy with Darice Simpler   Patient was given contact information for behavioral health clinic and was instructed to call 911 for emergencies.   Subjective:  Chief Complaint:  Chief Complaint  Patient presents with   Follow-up    Interval History:   Patient reports recent onset of depression and anxiety, describing these symptoms as reminiscent of her experience during COVID. She states she has previously engaged in therapy but did not feel she was making progress in those sessions. She now wishes to re-engage in mental health follow-up.  Patient is primarily preoccupied with anxiety, stating she cannot recall a time when she has not felt anxious. She  tearfully shares persistent fears and describes associated symptoms including restlessness, poor concentration, irritability, sleep disturbances, and feeling keyed up and on edge. She notes that her upbringing was marked by her mother focusing attention on her younger sister.  Patient is open to considering medication for her symptoms but expresses hesitation about the prospect of needing to take medication permanently.    Visit Diagnosis:    ICD-10-CM   1. Major depressive disorder, recurrent episode, moderate (HCC)  F33.1     2. Generalized anxiety disorder  F41.1       Past Psychiatric History:  Patient reports that she was diagnosed with depression and anxiety. Patient denies a past history of hospitalization due to mental health. Patient denies a past history of suicide attempt. Patient denies a past history of homicide attempt.   Additional Social History:  Patient endorses social support.  Patient denies having children of her own.  Patient endorses housing.  Patient is currently unemployed.  Patient denies a past history of military experience.  Patient denies a past history of prison or jail time.  Patient is currently attending [employer].  Patient denies access to weapons.  Social History   Socioeconomic History   Marital status: Single    Spouse name: Not on file   Number of children: Not on file   Years of education: Not on file   Highest education level: Not on file  Occupational History   Not on file  Tobacco Use   Smoking status: Unknown   Smokeless tobacco: Not on file  Substance and Sexual Activity   Alcohol use: Not Currently   Drug use: Not Currently   Sexual activity: Yes  Other Topics Concern   Not on  file  Social History Narrative   Not on file   Social Drivers of Health   Financial Resource Strain: Not on File (01/05/2022)   Received from [employer]    Financial Resource Strain: 0  Food Insecurity: Not at Risk (05/06/2024)    Received from [facility] Insecurity    Within the past 12 months, the food you bought just didn't last and you didn't have enough money to get more.: 1  Transportation Needs: Not at Risk (05/06/2024)   Received from [employer] Needs    In the past 12 months, has lack of transportation kept you from medical appointments, meetings, work or from getting things needed for daily living? (Check all that apply): 1  Physical Activity: Not on File (01/05/2022)   Received from [facility]   Physical Activity    Physical Activity: 0  Stress: Not on File (01/05/2022)   Received from [facility]   Stress    Stress: 0  Social Connections: Not on File (03/28/2023)   Received from [facility]   Social Connections    Connectedness: 0    Allergies:  Allergies  Allergen Reactions   Shellfish Allergy Hives    Current Medications: Current Outpatient Medications  Medication Sig Dispense Refill   metroNIDAZOLE  (FLAGYL ) 500 MG tablet Take 1 tablet (500 mg total) by mouth 2 (two) times daily. 14 tablet 0   No current facility-administered medications for this visit.    ROS: Review of Systems  All other systems reviewed and are negative.   Objective:  Objective: Psychiatric Specialty Exam: General Appearance: Casual, fairly groomed  Eye Contact:  Good    Speech:  Clear, coherent, normal rate, spontaneous  Volume:  Normal   Mood:  see above  Affect:  tearful  Thought Content: Logical  Suicidal Thoughts: see subjective  Thought Process:  Coherent, goal-directed, circumstantial   Orientation:  A&Ox4   Memory:  Immediate good  Judgment:  Fair   Insight:  Fair  Concentration:  Attention and concentration good   Recall:  Good  Fund of Knowledge: Good  Language: Good, fluent  Psychomotor Activity: Normal  Akathisia:  NA   AIMS (if indicated): NA   Assets:   Communication Skills Desire for Improvement Financial Resources/Insurance Housing Physical Health Resilience Social Support   ADL's:  Intact  Cognition: WNL  Sleep: see above  Appetite: see above    Physical Exam Vitals reviewed.  Constitutional:      General: She is not in acute distress.    Appearance: She is not ill-appearing.  HENT:     Head: Normocephalic and atraumatic.  Eyes:     Extraocular Movements: Extraocular movements intact.     Conjunctiva/sclera: Conjunctivae normal.  Pulmonary:     Effort: Pulmonary effort is normal. No respiratory distress.  Musculoskeletal:        General: Normal range of motion.  Neurological:     General: No focal deficit present.      Metabolic Disorder Labs: No results found for: HGBA1C, MPG No results found for: PROLACTIN No results found for: CHOL, TRIG, HDL, CHOLHDL, VLDL, LDLCALC No results found for: TSH  Therapeutic Level Labs: No results found for: LITHIUM No results found for: VALPROATE No results found for: CBMZ  Screenings:  GAD-7    Flowsheet Row Clinical Support from 05/28/2024 in [facility] Office Visit from 04/16/2024 in [facility]  Total GAD-7 Score 16 18  PHQ2-9    Flowsheet Row Clinical Support from 05/28/2024 in [facility] Office Visit from 04/16/2024 in [facility] Counselor from 04/14/2024 in [facility]  PHQ-2 Total Score 4 5 4   PHQ-9 Total Score 14 16 13    Flowsheet Row Office Visit from 04/16/2024 in [facility] Counselor from 04/14/2024 in [facility] ED from 04/13/2023 in [facility] Emergency Department at [facility]  C-SSRS RISK CATEGORY No Risk Error: Question 6 not populated No Risk    Collaboration of Care:   Patient/Guardian was advised Release of Information must be obtained prior to any record release in order to collaborate their care with an outside provider. Patient/Guardian was  advised if they have not already done so to contact the registration department to sign all necessary forms in order for us  to release information regarding their care.   Consent: Patient/Guardian gives verbal consent for treatment and assignment of benefits for services provided during this visit. Patient/Guardian expressed understanding and agreed to proceed.    Marlo Masson, MD 05/28/2024, 5:28 PM

## 2024-06-25 ENCOUNTER — Encounter (HOSPITAL_COMMUNITY): Admitting: Student in an Organized Health Care Education/Training Program

## 2024-06-25 NOTE — Progress Notes (Unsigned)
 BH MD Outpatient Progress Note  06/25/2024 2:02 PM Phala Schraeder  MRN:  [id number]  Assessment:  Lateisha Thurlow presents for follow-up evaluation on 06/25/2024 .    ***  Since the last visit, the patients depressive and anxiety symptoms remain unchanged, with ongoing stressors related to family dynamics contributing to her distress. She is hesitant to start medication but was open to discussion and received psychoeducation regarding antidepressant options. She prefers to consider medication at a future visit and is currently benefiting from therapy. No acute safety concerns identified. Plan is to continue current therapeutic approach, revisit medication initiation at next visit, and provide ongoing support.   Identifying Information: Lyda Colcord is a 23 y.o. female with a history of MDD and GAD who is an established patient with [facility] for management of mood symptoms. Initial evaluation by Collene Reginia BRAVO, PA completed on 04/16/2024. For a comprehensive history and detailed assessment, please refer to the initial adult assessment.  Plan:  # MDD # GAD Past medication trials:  Status of problem: *** Interventions: -- ***Patient requested to defer starting medications until next follow up visit -- ***Continue therapy with Darice Simpler   Patient was given contact information for behavioral health clinic and was instructed to call 911 for emergencies.   Subjective:  Chief Complaint:  No chief complaint on file.   Interval History:    *** Depression: Anxiety: AEs to medications: Medication compliance (missing doses, taking as directed):  Sleep: Appetite: Caffeine: Recent substance use: Alcohol - Tobacco -  Cannabis - Other - SI: HI: AVH:     *** Patient reports recent onset of depression and anxiety, describing these symptoms as reminiscent of her experience during COVID. She states she has previously engaged in therapy but did not  feel she was making progress in those sessions. She now wishes to re-engage in mental health follow-up.  Patient is primarily preoccupied with anxiety, stating she cannot recall a time when she has not felt anxious. She tearfully shares persistent fears and describes associated symptoms including restlessness, poor concentration, irritability, sleep disturbances, and feeling keyed up and on edge. She notes that her upbringing was marked by her mother focusing attention on her younger sister.  Patient is open to considering medication for her symptoms but expresses hesitation about the prospect of needing to take medication permanently.    Visit Diagnosis:  No diagnosis found.   Past Psychiatric History:  Patient reports that she was diagnosed with depression and anxiety. Patient denies a past history of hospitalization due to mental health. Patient denies a past history of suicide attempt. Patient denies a past history of homicide attempt.   Additional Social History:  Patient endorses social support.  Patient denies having children of her own.  Patient endorses housing.  Patient is currently unemployed.  Patient denies a past history of military experience.  Patient denies a past history of prison or jail time.  Patient is currently attending [employer].  Patient denies access to weapons.  Social History   Socioeconomic History   Marital status: Single    Spouse name: Not on file   Number of children: Not on file   Years of education: Not on file   Highest education level: Not on file  Occupational History   Not on file  Tobacco Use   Smoking status: Unknown   Smokeless tobacco: Not on file  Substance and Sexual Activity   Alcohol use: Not Currently   Drug use: Not Currently   Sexual  activity: Yes  Other Topics Concern   Not on file  Social History Narrative   Not on file   Social Drivers of Health   Tobacco Use: Low Risk (09/05/2023)   Received from [facility]    Patient History    Smoking Tobacco Use: Never    Smokeless Tobacco Use: Never    Passive Exposure: Not on file  Financial Resource Strain: Not on File (01/05/2022)   Received from [employer]    Financial Resource Strain: 0  Food Insecurity: Not at Risk (05/06/2024)   Received from [facility] Insecurity    Within the past 12 months, the food you bought just didn't last and you didn't have enough money to get more.: 1  Transportation Needs: Not at Risk (05/06/2024)   Received from [employer] Needs    In the past 12 months, has lack of transportation kept you from medical appointments, meetings, work or from getting things needed for daily living? (Check all that apply): 1  Physical Activity: Not on File (01/05/2022)   Received from [facility]   Physical Activity    Physical Activity: 0  Stress: Not on File (01/05/2022)   Received from [facility]   Stress    Stress: 0  Social Connections: Not on File (03/28/2023)   Received from [facility]   Social Connections    Connectedness: 0  Depression (PHQ2-9): High Risk (05/28/2024)   Depression (PHQ2-9)    PHQ-2 Score: 14  Alcohol Screen: Not on file  Housing: Not on file  Utilities: Not on file  Health Literacy: Not on file    Allergies:  Allergies  Allergen Reactions   Shellfish Allergy Hives    Current Medications: Current Outpatient Medications  Medication Sig Dispense Refill   metroNIDAZOLE  (FLAGYL ) 500 MG tablet Take 1 tablet (500 mg total) by mouth 2 (two) times daily. 14 tablet 0   No current facility-administered medications for this visit.    ROS: Review of Systems  All other systems reviewed and are negative.   Objective:  Objective: Psychiatric Specialty Exam: General Appearance: Casual, fairly groomed  Eye Contact:  Good    Speech:  Clear, coherent, normal rate, spontaneous  Volume:  Normal   Mood:  see above  Affect:  tearful  Thought Content: Logical***  Suicidal  Thoughts: see subjective  Thought Process:  Coherent, goal-directed, circumstantial ***  Orientation:  A&Ox4   Memory:  Immediate good  Judgment:  Fair ***  Insight:  Fair ***  Concentration:  Attention and concentration good   Recall:  Good  Fund of Knowledge: Good  Language: Good, fluent  Psychomotor Activity: Normal  Akathisia:  NA   AIMS (if indicated): NA   Assets:   Communication Skills Desire for Improvement Financial Resources/Insurance Housing Physical Health Resilience Social Support  ADL's:  Intact  Cognition: WNL  Sleep: see above  Appetite: see above    Physical Exam Vitals reviewed.  Constitutional:      General: She is not in acute distress.    Appearance: She is not ill-appearing.  HENT:     Head: Normocephalic and atraumatic.  Eyes:     Extraocular Movements: Extraocular movements intact.     Conjunctiva/sclera: Conjunctivae normal.  Pulmonary:     Effort: Pulmonary effort is normal. No respiratory distress.  Musculoskeletal:        General: Normal range of motion.  Neurological:     General: No focal deficit present.  Metabolic Disorder Labs: No results found for: HGBA1C, MPG No results found for: PROLACTIN No results found for: CHOL, TRIG, HDL, CHOLHDL, VLDL, LDLCALC No results found for: TSH  Therapeutic Level Labs: No results found for: LITHIUM No results found for: VALPROATE No results found for: CBMZ  Screenings:  GAD-7    Flowsheet Row Clinical Support from 05/28/2024 in [facility] Office Visit from 04/16/2024 in [facility]  Total GAD-7 Score 16 18   PHQ2-9    Flowsheet Row Clinical Support from 05/28/2024 in [facility] Office Visit from 04/16/2024 in [facility] Counselor from 04/14/2024 in [facility]  PHQ-2 Total Score 4 5 4   PHQ-9 Total Score 14 16 13     Flowsheet Row Office Visit from 04/16/2024 in [facility] Counselor from 04/14/2024 in [facility] ED from 04/13/2023 in [facility] Emergency Department at [facility]  C-SSRS RISK CATEGORY No Risk Error: Question 6 not populated No Risk    Collaboration of Care:   Patient/Guardian was advised Release of Information must be obtained prior to any record release in order to collaborate their care with an outside provider. Patient/Guardian was advised if they have not already done so to contact the registration department to sign all necessary forms in order for us  to release information regarding their care.   Consent: Patient/Guardian gives verbal consent for treatment and assignment of benefits for services provided during this visit. Patient/Guardian expressed understanding and agreed to proceed.    Marlo Masson, MD 06/25/2024, 2:02 PM

## 2024-06-26 ENCOUNTER — Encounter (HOSPITAL_COMMUNITY): Payer: Self-pay | Admitting: Student in an Organized Health Care Education/Training Program

## 2024-06-26 ENCOUNTER — Ambulatory Visit (INDEPENDENT_AMBULATORY_CARE_PROVIDER_SITE_OTHER): Admitting: Student in an Organized Health Care Education/Training Program

## 2024-06-26 VITALS — BP 110/55 | HR 87 | Ht 63.0 in | Wt 205.0 lb

## 2024-06-26 DIAGNOSIS — F411 Generalized anxiety disorder: Secondary | ICD-10-CM

## 2024-06-26 DIAGNOSIS — F331 Major depressive disorder, recurrent, moderate: Secondary | ICD-10-CM | POA: Diagnosis not present

## 2024-06-26 MED ORDER — ESCITALOPRAM OXALATE 5 MG PO TABS: 5.0000 mg | ORAL_TABLET | Freq: Every day | ORAL | 0 refills | Status: DC

## 2024-06-26 NOTE — Progress Notes (Signed)
 BH MD Outpatient Progress Note  06/26/2024 1:58 PM Carla Bates  MRN:  [id number]  Assessment:  Carla Bates presents for follow-up evaluation on 06/26/2024 .   At todays visit, the patient continues to experience depressive symptoms in the context of ongoing psychosocial stressors, including persistent low mood and anhedonia. Since the last appointment, there has been no significant change in symptom severity and she was amenable to starting Lexapro 5 mg. She remains engaged in therapy, with an initial session scheduled with Paige on 12/16, and is receptive to integrating cognitive-behavioral strategies, including journaling, as introduced during this encounter.   Identifying Information: Carla Bates is a 23 y.o. female with a history of MDD and GAD who is an established patient with [facility] for management of mood symptoms. Initial evaluation by Collene Reginia BRAVO, PA completed on 04/16/2024. For a comprehensive history and detailed assessment, please refer to the initial adult assessment.  Plan:  # MDD # GAD Past medication trials: none prior Status of problem: Chronic, unchanged Interventions: -- Start Lexapro 5 mg daily -- Therapy: Paige Cozart LSCW   Patient was given contact information for behavioral health clinic and was instructed to call 911 for emergencies.   Subjective:  Chief Complaint:  Chief Complaint  Patient presents with   Follow-up    Interval History:  The patient reports a reduction in overall stress since starting her winter break, with academic pressures now less prominent. Despite this, she continues to experience significant irritability within the home environment. She identifies possible feelings of resentment contributing to her mood and interpersonal difficulties. The patient expresses openness to family counseling as a means to address these relational issues.  She remains amenable to starting Lexapro to target her  depressive symptoms. CBT principles were discussed, and she is receptive to incorporating these strategies into her care. She reports adequate sleep and appetite. There is no current or recent substance use. She denies suicidal ideation, homicidal ideation, or perceptual disturbances.    Visit Diagnosis:    ICD-10-CM   1. Major depressive disorder, recurrent episode, moderate (HCC)  F33.1 escitalopram (LEXAPRO) 5 MG tablet    2. Generalized anxiety disorder  F41.1 escitalopram (LEXAPRO) 5 MG tablet       Past Psychiatric History:  Patient reports that she was diagnosed with depression and anxiety. Patient denies a past history of hospitalization due to mental health. Patient denies a past history of suicide attempt. Patient denies a past history of homicide attempt.   Additional Social History:  Patient endorses social support.  Patient denies having children of her own.  Patient endorses housing.  Patient is currently unemployed.  Patient denies a past history of military experience.  Patient denies a past history of prison or jail time.  Patient is currently attending [employer].  Patient denies access to weapons.  Social History   Socioeconomic History   Marital status: Single    Spouse name: Not on file   Number of children: Not on file   Years of education: Not on file   Highest education level: Not on file  Occupational History   Not on file  Tobacco Use   Smoking status: Unknown   Smokeless tobacco: Not on file  Substance and Sexual Activity   Alcohol use: Not Currently   Drug use: Not Currently   Sexual activity: Yes  Other Topics Concern   Not on file  Social History Narrative   Not on file   Social Drivers of Health  Tobacco Use: Low Risk (09/05/2023)   Received from [facility]   Patient History    Smoking Tobacco Use: Never    Smokeless Tobacco Use: Never    Passive Exposure: Not on file  Financial Resource Strain: Not on File (01/05/2022)   Received from  [employer]    Financial Resource Strain: 0  Food Insecurity: Not at Risk (05/06/2024)   Received from [facility] In[facility]    Within the past 12 months, the food you bought just didn't last and you didn't have enough money to get more.: 1  Transportation Needs: Not at Risk (05/06/2024)   Received from [employer] Needs    In the past 12 months, has lack of transportation kept you from medical appointments, meetings, work or from getting things needed for daily living? (Check all that apply): 1  Physical Activity: Not on File (01/05/2022)   Received from [facility]   Physical Activity    Physical Activity: 0  Stress: Not on File (01/05/2022)   Received from [facility]   Stress    Stress: 0  Social Connections: Not on File (03/28/2023)   Received from [facility]   Social Connections    Connectedness: 0  Depression (PHQ2-9): High Risk (05/28/2024)   Depression (PHQ2-9)    PHQ-2 Score: 14  Alcohol Screen: Not on file  Housing: Not on file  Utilities: Not on file  Health Literacy: Not on file    Allergies:  Allergies  Allergen Reactions   Shellfish Allergy Hives    Current Medications: Current Outpatient Medications  Medication Sig Dispense Refill   escitalopram (LEXAPRO) 5 MG tablet Take 1 tablet (5 mg total) by mouth daily. 60 tablet 0   metroNIDAZOLE  (FLAGYL ) 500 MG tablet Take 1 tablet (500 mg total) by mouth 2 (two) times daily. 14 tablet 0   No current facility-administered medications for this visit.    ROS: Review of Systems  All other systems reviewed and are negative.   Objective:  Objective: Psychiatric Specialty Exam: General Appearance: Casual, fairly groomed  Eye Contact:  Good    Speech:  Clear, coherent, normal rate, spontaneous  Volume:  Normal   Mood:  see above  Affect:  tearful  Thought Content: Logical  Suicidal Thoughts: see subjective  Thought Process:  Coherent, goal-directed, circumstantial   Orientation:  A&Ox4    Memory:  Immediate good  Judgment:  Fair   Insight:  Fair   Concentration:  Attention and concentration good   Recall:  Good  Fund of Knowledge: Good  Language: Good, fluent  Psychomotor Activity: Normal  Akathisia:  NA   AIMS (if indicated): NA   Assets:   Communication Skills Desire for Improvement Financial Resources/Insurance Housing Physical Health Resilience Social Support  ADL's:  Intact  Cognition: WNL  Sleep: see above  Appetite: see above    Physical Exam Vitals reviewed.  Constitutional:      General: She is not in acute distress.    Appearance: She is not ill-appearing.  HENT:     Head: Normocephalic and atraumatic.  Eyes:     Extraocular Movements: Extraocular movements intact.     Conjunctiva/sclera: Conjunctivae normal.  Pulmonary:     Effort: Pulmonary effort is normal. No respiratory distress.  Musculoskeletal:        General: Normal range of motion.  Neurological:     General: No focal deficit present.      Metabolic Disorder Labs: No results found for:  HGBA1C, MPG No results found for: PROLACTIN No results found for: CHOL, TRIG, HDL, CHOLHDL, VLDL, LDLCALC No results found for: TSH  Therapeutic Level Labs: No results found for: LITHIUM No results found for: VALPROATE No results found for: CBMZ  Screenings:  GAD-7    Flowsheet Row Clinical Support from 05/28/2024 in [facility] Office Visit from 04/16/2024 in [facility]  Total GAD-7 Score 16 18   PHQ2-9    Flowsheet Row Clinical Support from 05/28/2024 in [facility] Office Visit from 04/16/2024 in [facility] Counselor from 04/14/2024 in [facility]  PHQ-2 Total Score 4 5 4   PHQ-9 Total Score 14 16 13    Flowsheet Row Office Visit from 04/16/2024 in [facility] Counselor from 04/14/2024 in  [facility] ED from 04/13/2023 in [facility] Emergency Department at [facility]  C-SSRS RISK CATEGORY No Risk Error: Question 6 not populated No Risk    Collaboration of Care:   Patient/Guardian was advised Release of Information must be obtained prior to any record release in order to collaborate their care with an outside provider. Patient/Guardian was advised if they have not already done so to contact the registration department to sign all necessary forms in order for us  to release information regarding their care.   Consent: Patient/Guardian gives verbal consent for treatment and assignment of benefits for services provided during this visit. Patient/Guardian expressed understanding and agreed to proceed.    Marlo Masson, MD 06/26/2024, 1:58 PM

## 2024-06-29 NOTE — Progress Notes (Signed)
 Subjective PAP Smear and Breast Examination     Carla Bates is a 23 year old female here for No chief complaint on file. Carla Bates is a 23 year old female presents to the clinic for a pap smear with requested check for Chlamydia.  Carla Bates reports she tested positive for BV and Chlamydia on previous visit and request a recheck. She denies any dysuria or vaginal discharge or irritation.   Documentation Reviewed by Any User:    Current Outpatient Medications  Medication Sig Dispense Refill   ergocalciferol (VITAMIN D-2) 1,250 mcg (50,000 unit) capsule Take 1 Capsule by mouth once a week for 8 doses. 8 Capsule 0   nitrofurantoin  (MACRODANTIN ) 50 mg capsule TAKE 1 CAPSULE BY MOUTH EVERY DAY NIGHTLY     cetirizine (ZYRTEC) 10 mg tablet Take 1 Tablet by mouth once daily (Patient not taking: Reported on 05/06/2024.) 90 Tablet 3   fluticasone (FLONASE) 50 mcg/actuation nasal spray Place 1 Spray in both nostrils once daily (Patient not taking: Reported on 05/06/2024.) 16 g 1   Review of Systems  All other systems reviewed and are negative.    Objective   LMP 05/06/2024 (Approximate) (currently on 05/06/24)  OB Status Having periods  Smoking Status Never  Physical Exam Vitals and nursing note reviewed. : Carla Bates and mom present for examination.   Constitutional:      Appearance: Normal appearance. She has obesity HENT:     Head: Normocephalic and atraumatic.   Cardiovascular:     Rate and Rhythm: Normal rate and regular rhythm.     Pulses: Normal pulses.     Heart sounds: Normal heart sounds.  Pulmonary:     Effort: Pulmonary effort is normal.     Breath sounds: Normal breath sounds.  Chest:     Breasts:        Right: Normal.  Lymph Nodes (Right): Breast Lymph Node Right Normal       Left: Normal.  Lymph Nodes (Left): normal left breast lymph node.  Inspection: bilateral normal Palpation: bilateral normalGenitourinary:    General: Normal vulva.     Chaperone present  Carla Bates and mom present during examination.).    Genitalia: external female genitalia normal.        Labia Minora: Normal.         Labia Majora: Normal.          Surrounding Skin: Normal.         Urethra: Normal.         Rectum: Normal.     Comments: No abnormalities observed during pap smear examination.  Musculoskeletal:        General: Normal range of motion.     Cervical back: Normal range of motion.  Skin:    General: Skin is warm and dry.  Neurological:     General: No focal deficit present.     Mental Status: She is alert.  Psychiatric:        Mood and Affect: Mood normal.        Behavior: Behavior normal.        Thought Content: Thought content normal.        Judgment: Judgment normal.     Assessment and Plan   1. Screening for cervical cancer - PAP, LIQ-BASED, IMAGE GUIDED + HPV W/RFLX GENO 16/18 + 45 Cervical Swab Routine  2. Screening for STD (sexually transmitted disease) - VAGINITIS PLUS, NUSWAB Vaginal Vaginal Routine     Return for CPE with labs in  1 year.     Carla Pounds MSN,  APRN, FNP-BC Family Nurse Practitioner [facility]

## 2024-06-30 ENCOUNTER — Ambulatory Visit (HOSPITAL_COMMUNITY): Admitting: Clinical

## 2024-07-06 NOTE — Progress Notes (Signed)
 This encounter was created in error - please disregard.

## 2024-08-13 ENCOUNTER — Ambulatory Visit (HOSPITAL_COMMUNITY): Admitting: Student in an Organized Health Care Education/Training Program

## 2024-08-13 DIAGNOSIS — F411 Generalized anxiety disorder: Secondary | ICD-10-CM | POA: Diagnosis not present

## 2024-08-13 DIAGNOSIS — F331 Major depressive disorder, recurrent, moderate: Secondary | ICD-10-CM

## 2024-08-13 MED ORDER — ESCITALOPRAM OXALATE 5 MG PO TABS: 5.0000 mg | ORAL_TABLET | Freq: Every day | ORAL | 0 refills | Status: AC

## 2024-08-13 NOTE — Progress Notes (Signed)
 BH MD Outpatient Progress Note  08/13/2024 5:13 PM Carla Bates  MRN:  [id number]  Assessment:  Carla Bates presents for follow-up evaluation on 08/13/24 .   Since the last visit, the patient demonstrates improved affect and functional engagement, reporting a strong start to the current college semester, suggesting partial clinical improvement. She became nonadherent to Lexapro  due to anxiety about concomitant use with Flagyl  prescribed over the holidays; this appears driven by medication-related worry rather than intolerance. Anxiety symptoms remain unchanged from the prior visit and continue to warrant treatment focus. Psychoeducation and supportive counseling were provided, and she is now motivated and agreeable to resuming treatment.   Identifying Information: Carla Bates is a 24 y.o. female with a history of MDD and GAD who is an established patient with [facility] for management of mood symptoms. Initial evaluation by Collene Reginia BRAVO, PA completed on 04/16/2024. For a comprehensive history and detailed assessment, please refer to the initial adult assessment.  Plan:  # MDD # GAD #R/o Social Anxiety Past medication trials: none prior Status of problem: improved Interventions: -- Continue Lexapro  5 mg daily -- Therapy: Scheduled to see Robynn Ros, LCSW  om 09/10/2024  Patient was given contact information for behavioral health clinic and was instructed to call 911 for emergencies.   Subjective:  Chief Complaint:  Chief Complaint  Patient presents with   Follow-up    Interval History:  Patient reports overall improvement in depressive symptoms, noting improved mood, improved self worth, increased energy, and that her motivation has returned. She describes anxiety as improved at home but unchanged at school. Patient reports poor medication adherence, stating she missed doses due to being overly preoccupied with concerns about taking psychiatric  medications while on antibiotics over Christmas.  Patient reports chronic sleep difficulties, stating sleep has always been poor with an average of three to seven hours per night. She describes difficulty initiating sleep with a typical bedtime around 12 to 1 AM and reports that sleep hygiene was discussed extensively. Patient reports no recent substance use and denies suicidal ideation, homicidal ideation, and auditory or visual hallucinations.     Visit Diagnosis:    ICD-10-CM   1. Major depressive disorder, recurrent episode, moderate (HCC)  F33.1 escitalopram  (LEXAPRO ) 5 MG tablet    2. Generalized anxiety disorder  F41.1 escitalopram  (LEXAPRO ) 5 MG tablet        Past Psychiatric History:  Patient reports that she was diagnosed with depression and anxiety. Patient denies a past history of hospitalization due to mental health. Patient denies a past history of suicide attempt. Patient denies a past history of homicide attempt.   Additional Social History:  Patient endorses social support.  Patient denies having children of her own.  Patient endorses housing.  Patient is currently unemployed.  Patient denies a past history of military experience.  Patient denies a past history of prison or jail time.  Patient is currently attending [employer].  Patient denies access to weapons.  Social History   Socioeconomic History   Marital status: Single    Spouse name: Not on file   Number of children: Not on file   Years of education: Not on file   Highest education level: Not on file  Occupational History   Not on file  Tobacco Use   Smoking status: Unknown   Smokeless tobacco: Not on file  Substance and Sexual Activity   Alcohol use: Not Currently   Drug use: Not Currently   Sexual activity: Yes  Other Topics Concern   Not on file  Social History Narrative   Not on file   Social Drivers of Health   Tobacco Use: Low Risk (09/05/2023)   Received from [facility]   Patient  History    Smoking Tobacco Use: Never    Smokeless Tobacco Use: Never    Passive Exposure: Not on file  Financial Resource Strain: Not on File (01/05/2022)   Received from [employer] [facility]    Financial Resource Strain: 0  Food Insecurity: Not at Risk (05/06/2024)   Received from [facility] Insecurity    Within the past 12 months, the food you bought just didn't last and you didn't have enough money to get more.: 1  Transportation Needs: Not at Risk (05/06/2024)   Received from [employer] Needs    In the past 12 months, has lack of transportation kept you from medical appointments, meetings, work or from getting things needed for daily living? (Check all that apply): 1  Physical Activity: Not on File (01/05/2022)   Received from [facility]   Physical Activity    Physical Activity: 0  Stress: Not on File (01/05/2022)   Received from [facility]   Stress    Stress: 0  Social Connections: Not on File (03/28/2023)   Received from [facility]   Social Connections    Connectedness: 0  Depression (PHQ2-9): High Risk (08/13/2024)   Depression (PHQ2-9)    PHQ-2 Score: 11  Alcohol Screen: Not on file  Housing: Not on file  Utilities: Not on file  Health Literacy: Not on file    Allergies:  Allergies  Allergen Reactions   Shellfish Allergy Hives    Current Medications: Current Outpatient Medications  Medication Sig Dispense Refill   escitalopram  (LEXAPRO ) 5 MG tablet Take 1 tablet (5 mg total) by mouth daily. 60 tablet 0   metroNIDAZOLE  (FLAGYL ) 500 MG tablet Take 1 tablet (500 mg total) by mouth 2 (two) times daily. 14 tablet 0   No current facility-administered medications for this visit.    ROS: Review of Systems  All other systems reviewed and are negative.   Objective:  Objective: Psychiatric Specialty Exam: General Appearance: Casual, fairly groomed  Eye Contact:  Good    Speech:  Clear, coherent, normal rate, spontaneous  Volume:  Normal   Mood:   see above  Affect:  bright affect, significantly improved from prior  Thought Content: Logical  Suicidal Thoughts: see subjective  Thought Process:  Coherent, goal-directed, circumstantial   Orientation:  A&Ox4   Memory:  Immediate good  Judgment:  Fair   Insight:  Fair   Concentration:  Attention and concentration good   Recall:  Good  Fund of Knowledge: Good  Language: Good, fluent  Psychomotor Activity: Normal  Akathisia:  NA   AIMS (if indicated): NA   Assets:   Communication Skills Desire for Improvement Financial Resources/Insurance Housing Physical Health Resilience Social Support  ADL's:  Intact  Cognition: WNL  Sleep: see above  Appetite: see above    Physical Exam Vitals reviewed.  Constitutional:      General: She is not in acute distress.    Appearance: She is not ill-appearing.  HENT:     Head: Normocephalic and atraumatic.  Eyes:     Extraocular Movements: Extraocular movements intact.     Conjunctiva/sclera: Conjunctivae normal.  Pulmonary:     Effort: Pulmonary effort is normal. No respiratory distress.  Musculoskeletal:  General: Normal range of motion.  Neurological:     General: No focal deficit present.      Metabolic Disorder Labs: No results found for: HGBA1C, MPG No results found for: PROLACTIN No results found for: CHOL, TRIG, HDL, CHOLHDL, VLDL, LDLCALC No results found for: TSH  Therapeutic Level Labs: No results found for: LITHIUM No results found for: VALPROATE No results found for: CBMZ  Screenings:  GAD-7    Flowsheet Row Clinical Support from 08/13/2024 in [facility] Clinical Support from 05/28/2024 in [facility] Office Visit from 04/16/2024 in [facility]  Total GAD-7 Score 11 16 18    PHQ2-9    Flowsheet Row Clinical Support from 08/13/2024 in [facility] Clinical Support  from 05/28/2024 in [facility] Office Visit from 04/16/2024 in [facility]or from 04/14/2024 in [facility]  PHQ-2 Total Score 2 4 5 4   PHQ-9 Total Score 11 14 16 13    Flowsheet Row [facility] from 04/14/2024 in [facility] ED from 04/13/2023 in [facility] Department at [facility]  C-SSRS RISK CATEGORY No Risk Error: Question 6 not populated No Risk    Collaboration of Care:   Patient/Guardian was advised Release of Information must be obtained prior to any record release in order to collaborate their care with an outside provider. Patient/Guardian was advised if they have not already done so to contact the registration department to sign all necessary forms in order for us  to release information regarding their care.   Consent: Patient/Guardian gives verbal consent for treatment and assignment of benefits for services provided during this visit. Patient/Guardian expressed understanding and agreed to proceed.    Marlo Masson, MD 08/13/2024, 5:13 PM

## 2024-09-10 ENCOUNTER — Ambulatory Visit (HOSPITAL_COMMUNITY)

## 2024-09-24 ENCOUNTER — Encounter (HOSPITAL_COMMUNITY): Admitting: Student in an Organized Health Care Education/Training Program
